# Patient Record
Sex: Female | Born: 1985 | Race: Black or African American | Hispanic: No | Marital: Single | Smoking: Former smoker
Health system: Southern US, Community
[De-identification: ages and names within clinical notes are randomized; demographics above are authoritative.]

## PROBLEM LIST (undated history)

## (undated) DIAGNOSIS — K219 Gastro-esophageal reflux disease without esophagitis: Secondary | ICD-10-CM

## (undated) DIAGNOSIS — J45909 Unspecified asthma, uncomplicated: Secondary | ICD-10-CM

## (undated) DIAGNOSIS — T7840XA Allergy, unspecified, initial encounter: Secondary | ICD-10-CM

## (undated) HISTORY — DX: Gastro-esophageal reflux disease without esophagitis: K21.9

## (undated) HISTORY — DX: Unspecified asthma, uncomplicated: J45.909

## (undated) HISTORY — DX: Allergy, unspecified, initial encounter: T78.40XA

---

## 2012-03-26 HISTORY — PX: SPINE SURGERY: SHX786

## 2015-09-30 DIAGNOSIS — F329 Major depressive disorder, single episode, unspecified: Secondary | ICD-10-CM | POA: Diagnosis not present

## 2015-09-30 DIAGNOSIS — Z13220 Encounter for screening for lipoid disorders: Secondary | ICD-10-CM | POA: Diagnosis not present

## 2015-09-30 DIAGNOSIS — Z791 Long term (current) use of non-steroidal anti-inflammatories (NSAID): Secondary | ICD-10-CM | POA: Diagnosis not present

## 2015-09-30 DIAGNOSIS — Z0000 Encounter for general adult medical examination without abnormal findings: Secondary | ICD-10-CM | POA: Diagnosis not present

## 2015-09-30 DIAGNOSIS — G894 Chronic pain syndrome: Secondary | ICD-10-CM | POA: Diagnosis not present

## 2015-10-09 ENCOUNTER — Encounter (HOSPITAL_COMMUNITY): Payer: Self-pay | Admitting: Emergency Medicine

## 2015-10-09 ENCOUNTER — Emergency Department (HOSPITAL_COMMUNITY)
Admission: EM | Admit: 2015-10-09 | Discharge: 2015-10-09 | Disposition: A | Discharge status: Home / Self Care | Payer: BLUE CROSS/BLUE SHIELD | Attending: Emergency Medicine | Admitting: Emergency Medicine

## 2015-10-09 ENCOUNTER — Emergency Department (HOSPITAL_COMMUNITY): Payer: BLUE CROSS/BLUE SHIELD

## 2015-10-09 DIAGNOSIS — M545 Low back pain, unspecified: Secondary | ICD-10-CM

## 2015-10-09 DIAGNOSIS — Z87891 Personal history of nicotine dependence: Secondary | ICD-10-CM | POA: Insufficient documentation

## 2015-10-09 LAB — POC URINE PREG, ED: Preg Test, Ur: NEGATIVE

## 2015-10-09 MED ORDER — PREDNISONE 20 MG PO TABS: 60.0000 mg | ORAL_TABLET | Freq: Every day | ORAL | Status: DC

## 2015-10-09 MED ORDER — PREDNISONE 20 MG PO TABS
60.0000 mg | ORAL_TABLET | Freq: Once | ORAL | Status: AC
  Administered 2015-10-09: 60 mg via ORAL
  Filled 2015-10-09: qty 3

## 2015-10-09 NOTE — Discharge Instructions (Signed)
Please read and follow all provided instructions.  Your diagnoses today include:  1. Bilateral low back pain without sciatica    Tests performed today include:  Vital signs - see below for your results today  Medications prescribed:   Take any prescribed medications only as directed.  Home care instructions:   Follow any educational materials contained in this packet  Please rest, use ice or heat on your back for the next several days  Do not lift, push, pull anything more than 10 pounds for the next week  Follow-up instructions: Please follow-up with your primary care provider in the next 1 week for further evaluation of your symptoms.   Return instructions:  SEEK IMMEDIATE MEDICAL ATTENTION IF YOU HAVE:  New numbness, tingling, weakness, or problem with the use of your arms or legs  Severe back pain not relieved with medications  Loss control of your bowels or bladder  Increasing pain in any areas of the body (such as chest or abdominal pain)  Shortness of breath, dizziness, or fainting.   Worsening nausea (feeling sick to your stomach), vomiting, fever, or sweats  Any other emergent concerns regarding your health   Additional Information:  Your vital signs today were: BP 129/84 mmHg   Pulse 65   Temp(Src) 98.2 F (36.8 C) (Oral)   Resp 18   SpO2 96%   LMP 10/08/2015 If your blood pressure (BP) was elevated above 135/85 this visit, please have this repeated by your doctor within one month. --------------

## 2015-10-09 NOTE — ED Provider Notes (Signed)
CSN: [id number]     Arrival date & time 10/09/15  1644 History   First MD Initiated Contact with Patient 10/09/15 1653     Chief Complaint  Patient presents with  . Back Pain   (Consider location/radiation/quality/duration/timing/severity/associated sxs/prior Treatment) HPI 30 y.o. female with a hx of Spinal Fusion from [facility] in 2014 in [place], presents to the Emergency Department today complaining of back pain x 2 weeks. Pt does not endorse any trauma or falls. Pt states that she used to go to PT therapy since her surgery, but has not seen one in sometime. Currently scheduling with PCP for PT follow up and Pain Cinic follow up. Has been seen by PCP on Monday and given Flexeril and Mobic. Pt thinks that back pain could be related to her lifting at her work at [employer] on top of not having her PT. Notes pain is 10/10 with turning. No pain at rest. No loss of bowel or bladder function. No saddle anesthesia. No fevers. No CP/SOB/ABD pain. No dysuria. No other symptoms noted.   No past medical history on file. No past surgical history on file. No family history on file. Social History  Substance Use Topics  . Smoking status: Not on file  . Smokeless tobacco: Not on file  . Alcohol Use: Not on file   OB History    No data available     Review of Systems  Constitutional: Negative for fever.  Gastrointestinal: Negative for nausea and vomiting.  Genitourinary: Negative for difficulty urinating.  Musculoskeletal: Positive for back pain.  Neurological: Negative for numbness.   Allergies  Review of patient's allergies indicates not on file.  Home Medications   Prior to Admission medications   Not on File   BP 129/84 mmHg  Pulse 65  Temp(Src) 98.2 F (36.8 C) (Oral)  Resp 18  SpO2 96%   Physical Exam  Constitutional: She is oriented to person, place, and time. She appears well-developed and well-nourished.  HENT:  Head: Normocephalic and atraumatic.  Eyes: EOM are normal. Pupils  are equal, round, and reactive to light.  Neck: Normal range of motion. Neck supple. No tracheal deviation present.  Cardiovascular: Normal rate, regular rhythm, normal heart sounds and intact distal pulses.   No murmur heard. Pulmonary/Chest: Effort normal and breath sounds normal. No respiratory distress. She has no wheezes. She has no rales. She exhibits no tenderness.  Abdominal: Soft.  Musculoskeletal: Normal range of motion.  Surgical Scar noted from Thoracic Spine to Lumbar spine. Non infectious. TTP along lumbar spine. Muscular tenderness bilateral low back. Pt able to ambulate without difficulty.   Neurological: She is alert and oriented to person, place, and time.  Skin: Skin is warm and dry.  Psychiatric: She has a normal mood and affect. Her behavior is normal. Thought content normal.  Nursing note and vitals reviewed.  ED Course  Procedures (including critical care time) Labs Review Labs Reviewed  POC URINE PREG, ED   Imaging Review Dg Thoracic Spine 2 View  10/09/2015  CLINICAL DATA:  Back pain for 2 weeks.  Prior surgery. EXAM: THORACIC SPINE 2 VIEWS ; LUMBAR SPINE - COMPLETE 4+ VIEW COMPARISON:  None. FINDINGS: The thoracolumbar vertebral body heights are maintained. The alignment is anatomic. There is no acute fracture static listhesis. There is posterior spinal fusion from T9 through L2 without hardware failure or complication. No aggressive osseous lesion. IMPRESSION: 1. No acute osseous injury of the lumbar spine. 2. No acute osseous injury of the thoracic  spine. 3. Posterior lumbar fusion from T9 through L2 without hardware failure or complication. Electronically Signed   By: Kathreen Devoid   On: 10/09/2015 18:49   Dg Lumbar Spine Complete  10/09/2015  CLINICAL DATA:  Back pain for 2 weeks.  Prior surgery. EXAM: THORACIC SPINE 2 VIEWS ; LUMBAR SPINE - COMPLETE 4+ VIEW COMPARISON:  None. FINDINGS: The thoracolumbar vertebral body heights are maintained. The alignment is  anatomic. There is no acute fracture static listhesis. There is posterior spinal fusion from T9 through L2 without hardware failure or complication. No aggressive osseous lesion. IMPRESSION: 1. No acute osseous injury of the lumbar spine. 2. No acute osseous injury of the thoracic spine. 3. Posterior lumbar fusion from T9 through L2 without hardware failure or complication. Electronically Signed   By: Kathreen Devoid   On: 10/09/2015 18:49   I have personally reviewed and evaluated these images and lab results as part of my medical decision-making.   EKG Interpretation None      MDM  I have reviewed and evaluated the relevant imaging studies.  I have reviewed the relevant previous healthcare records. I obtained HPI from historian.  ED Course:  Assessment: Patient is a 68 with a hx of F who presents to the ED with back pain. No neurological deficits appreciated. Patient is ambulatory. No warning symptoms of back pain including: fecal incontinence, urinary retention or overflow incontinence, night sweats, waking from sleep with back pain, unexplained fevers or weight loss, h/o cancer, IVDU, recent trauma. No concern for cauda equina, epidural abscess, or other serious cause of back pain. Imaging of Thoracic and Lumbar spine without acute abnormalities. Hardware in place. Conservative measures such as rest, ice/heat and pain medicine indicated with PCP follow-up if no improvement with conservative management. Given Rx Prednisone. At time of discharge, Patient is in no acute distress. Vital Signs are stable. Patient is able to ambulate. Patient able to tolerate PO.   Disposition/Plan:  DC Home Additional Verbal discharge instructions given and discussed with patient.  Pt Instructed to f/u with PCP in the next week for evaluation and treatment of symptoms. Return precautions given Pt acknowledges and agrees with plan  Supervising Physician Quintella Reichert, MD   Final diagnoses:  Bilateral low back  pain without sciatica     Shary Decamp, PA-C 10/09/15 1908  Quintella Reichert, MD 10/10/15 1452
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
## 2015-10-09 NOTE — ED Notes (Signed)
PA at bedside. Will complete triage when PA finished assessment.

## 2015-10-19 DIAGNOSIS — M545 Low back pain: Secondary | ICD-10-CM | POA: Diagnosis not present

## 2015-10-19 DIAGNOSIS — M546 Pain in thoracic spine: Secondary | ICD-10-CM | POA: Diagnosis not present

## 2015-10-19 DIAGNOSIS — F329 Major depressive disorder, single episode, unspecified: Secondary | ICD-10-CM | POA: Diagnosis not present

## 2015-10-19 DIAGNOSIS — G8929 Other chronic pain: Secondary | ICD-10-CM | POA: Diagnosis not present

## 2015-11-03 DIAGNOSIS — D473 Essential (hemorrhagic) thrombocythemia: Secondary | ICD-10-CM | POA: Diagnosis not present

## 2015-11-03 DIAGNOSIS — E875 Hyperkalemia: Secondary | ICD-10-CM | POA: Diagnosis not present

## 2015-11-03 DIAGNOSIS — R011 Cardiac murmur, unspecified: Secondary | ICD-10-CM | POA: Diagnosis not present

## 2015-11-03 DIAGNOSIS — G894 Chronic pain syndrome: Secondary | ICD-10-CM | POA: Diagnosis not present

## 2015-11-03 LAB — LIPID PANEL
CHOLESTEROL: 168 (ref 0–200)
HDL: 63 (ref 35–70)
LDL Cholesterol: 90
Triglycerides: 76 (ref 40–160)

## 2015-11-03 LAB — TSH: TSH: 0.82 (ref 0.41–5.90)

## 2015-11-03 LAB — BASIC METABOLIC PANEL
BUN: 16 (ref 4–21)
CREATININE: 0.7 (ref 0.5–1.1)
GLUCOSE: 96
POTASSIUM: 4.6 (ref 3.4–5.3)
Sodium: 138 (ref 137–147)

## 2015-11-03 LAB — CBC AND DIFFERENTIAL
HCT: 37 (ref 36–46)
Hemoglobin: 12 (ref 12.0–16.0)
Platelets: 388 (ref 150–399)
WBC: 9

## 2015-12-06 ENCOUNTER — Ambulatory Visit: Payer: BLUE CROSS/BLUE SHIELD | Admitting: Nurse Practitioner

## 2015-12-15 DIAGNOSIS — M549 Dorsalgia, unspecified: Secondary | ICD-10-CM | POA: Diagnosis not present

## 2015-12-19 DIAGNOSIS — G8929 Other chronic pain: Secondary | ICD-10-CM | POA: Diagnosis not present

## 2015-12-19 DIAGNOSIS — G894 Chronic pain syndrome: Secondary | ICD-10-CM | POA: Diagnosis not present

## 2015-12-19 DIAGNOSIS — M545 Low back pain: Secondary | ICD-10-CM | POA: Diagnosis not present

## 2015-12-19 DIAGNOSIS — M546 Pain in thoracic spine: Secondary | ICD-10-CM | POA: Diagnosis not present

## 2015-12-28 ENCOUNTER — Ambulatory Visit: Payer: BLUE CROSS/BLUE SHIELD | Admitting: Nurse Practitioner

## 2016-03-30 DIAGNOSIS — G894 Chronic pain syndrome: Secondary | ICD-10-CM | POA: Diagnosis not present

## 2016-08-11 ENCOUNTER — Encounter (HOSPITAL_COMMUNITY): Payer: Self-pay | Admitting: Nurse Practitioner

## 2016-08-11 ENCOUNTER — Emergency Department (HOSPITAL_COMMUNITY)
Admission: EM | Admit: 2016-08-11 | Discharge: 2016-08-11 | Disposition: A | Discharge status: Home / Self Care | Payer: Medicare Other | Attending: Emergency Medicine | Admitting: Emergency Medicine

## 2016-08-11 DIAGNOSIS — M546 Pain in thoracic spine: Secondary | ICD-10-CM | POA: Insufficient documentation

## 2016-08-11 DIAGNOSIS — Z79899 Other long term (current) drug therapy: Secondary | ICD-10-CM | POA: Diagnosis not present

## 2016-08-11 DIAGNOSIS — Z87891 Personal history of nicotine dependence: Secondary | ICD-10-CM | POA: Diagnosis not present

## 2016-08-11 DIAGNOSIS — G8929 Other chronic pain: Secondary | ICD-10-CM | POA: Insufficient documentation

## 2016-08-11 MED ORDER — HYDROCODONE-ACETAMINOPHEN 5-325 MG PO TABS
1.0000 | ORAL_TABLET | Freq: Once | ORAL | Status: AC
  Administered 2016-08-11: 1 via ORAL
  Filled 2016-08-11: qty 1

## 2016-08-11 MED ORDER — SODIUM CHLORIDE 0.9 % IV BOLUS (SEPSIS): 1000.0000 mL | Freq: Once | INTRAVENOUS | Status: DC

## 2016-08-11 NOTE — Discharge Instructions (Signed)
Please read attached information. If you experience any new or worsening signs or symptoms please return to the emergency room for evaluation. Please follow-up with your primary care provider or specialist as discussed.  °
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
## 2016-08-11 NOTE — ED Triage Notes (Signed)
Pt c/o severe back pain. States she has hardware in her spine and was in the care of a neurologist in [place].

## 2016-09-11 ENCOUNTER — Encounter: Payer: Self-pay | Admitting: Family Medicine

## 2016-09-11 ENCOUNTER — Ambulatory Visit (INDEPENDENT_AMBULATORY_CARE_PROVIDER_SITE_OTHER): Payer: Medicare Other | Admitting: Family Medicine

## 2016-09-11 VITALS — BP 126/80 | HR 73 | Resp 12 | Ht 60.0 in | Wt 163.0 lb

## 2016-09-11 DIAGNOSIS — G8929 Other chronic pain: Secondary | ICD-10-CM | POA: Diagnosis not present

## 2016-09-11 DIAGNOSIS — G894 Chronic pain syndrome: Secondary | ICD-10-CM | POA: Diagnosis not present

## 2016-09-11 DIAGNOSIS — M549 Dorsalgia, unspecified: Secondary | ICD-10-CM

## 2016-09-11 DIAGNOSIS — R202 Paresthesia of skin: Secondary | ICD-10-CM | POA: Insufficient documentation

## 2016-09-11 DIAGNOSIS — R200 Anesthesia of skin: Secondary | ICD-10-CM | POA: Insufficient documentation

## 2016-09-11 DIAGNOSIS — M5410 Radiculopathy, site unspecified: Secondary | ICD-10-CM | POA: Insufficient documentation

## 2016-09-11 DIAGNOSIS — R42 Dizziness and giddiness: Secondary | ICD-10-CM | POA: Diagnosis not present

## 2016-09-11 DIAGNOSIS — Z6831 Body mass index (BMI) 31.0-31.9, adult: Secondary | ICD-10-CM

## 2016-09-11 DIAGNOSIS — E669 Obesity, unspecified: Secondary | ICD-10-CM

## 2016-09-11 MED ORDER — DULOXETINE HCL 30 MG PO CPEP: 30.0000 mg | ORAL_CAPSULE | Freq: Every day | ORAL | 1 refills | Status: DC

## 2016-09-11 MED ORDER — MELOXICAM 15 MG PO TABS: 15.0000 mg | ORAL_TABLET | Freq: Every day | ORAL | 1 refills | Status: DC

## 2016-09-11 MED ORDER — GABAPENTIN 600 MG PO TABS: 600.0000 mg | ORAL_TABLET | Freq: Two times a day (BID) | ORAL | 1 refills | Status: DC
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
## 2016-09-11 NOTE — Patient Instructions (Addendum)
A few things to remember from today's visit:   Chronic pain disorder - Plan: Ambulatory referral to Pain Clinic  Radicular pain of both lower extremities - Plan: Ambulatory referral to Pain Clinic, gabapentin (NEURONTIN) 600 MG tablet, DULoxetine (CYMBALTA) 30 MG capsule  Chronic bilateral back pain, unspecified back location - Plan: Ambulatory referral to Pain Clinic, meloxicam (MOBIC) 15 MG tablet  Class 1 obesity without serious comorbidity with body mass index (BMI) of 31.0 to 31.9 in adult, unspecified obesity type  Lightheadedness   Please be sure medication list is accurate. If a new problem present, please set up appointment sooner than planned today.

## 2016-09-15 ENCOUNTER — Encounter: Payer: Self-pay | Admitting: Family Medicine

## 2016-09-21 ENCOUNTER — Encounter: Payer: Self-pay | Admitting: Family Medicine

## 2016-09-25 ENCOUNTER — Telehealth: Payer: Self-pay | Admitting: Family Medicine

## 2016-09-25 NOTE — Telephone Encounter (Signed)
° ° °  Pt ask if you Summer Frey would give her a call. She is requesting some type of letter about her LOA    [phone]

## 2016-09-27 NOTE — Telephone Encounter (Signed)
Left voicemail for patient to call the office back.   

## 2016-10-01 NOTE — Telephone Encounter (Signed)
Left voicemail for patient to call the office back & to leave the details of what she needs in the letter when she calls back.

## 2016-10-05 NOTE — Telephone Encounter (Signed)
Have been unsuccessful in reaching patient, will mail letter to patient asking to return the call or we can discuss this during her office visit on 7/31.
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
## 2016-10-23 ENCOUNTER — Encounter: Payer: Self-pay | Admitting: Family Medicine

## 2016-10-23 ENCOUNTER — Ambulatory Visit (INDEPENDENT_AMBULATORY_CARE_PROVIDER_SITE_OTHER): Payer: Medicare Other | Admitting: Family Medicine

## 2016-10-23 VITALS — BP 110/70 | HR 66 | Resp 12 | Ht 60.0 in | Wt 163.0 lb

## 2016-10-23 DIAGNOSIS — G8929 Other chronic pain: Secondary | ICD-10-CM

## 2016-10-23 DIAGNOSIS — M5410 Radiculopathy, site unspecified: Secondary | ICD-10-CM | POA: Diagnosis not present

## 2016-10-23 DIAGNOSIS — M549 Dorsalgia, unspecified: Secondary | ICD-10-CM

## 2016-10-23 MED ORDER — MELOXICAM 15 MG PO TABS: 15.0000 mg | ORAL_TABLET | Freq: Every day | ORAL | 1 refills | Status: DC

## 2016-10-23 MED ORDER — DULOXETINE HCL 30 MG PO CPEP: 30.0000 mg | ORAL_CAPSULE | Freq: Every day | ORAL | 1 refills | Status: DC

## 2016-10-23 NOTE — Patient Instructions (Addendum)
A few things to remember from today's visit:   Chronic bilateral back pain, unspecified back location - Plan: meloxicam (MOBIC) 15 MG tablet  Radicular pain of both lower extremities - Plan: DULoxetine (CYMBALTA) 30 MG capsule  Pending appt with pain clinic.  Form will be filled out an faxed.  Please find about about PT/pool exercises and let me know if your insurance will cover for any of those.    Please be sure medication list is accurate. If a new problem present, please set up appointment sooner than planned today.

## 2016-11-09 ENCOUNTER — Telehealth: Payer: Self-pay | Admitting: Family Medicine

## 2016-11-09 NOTE — Telephone Encounter (Signed)
The form is filled out. I was waiting for ortho evaluation for most detail information about restrictions they did recommend after evaluation. Form can be faxed as it is now.  Thanks, BJ

## 2016-11-09 NOTE — Telephone Encounter (Signed)
Pt states her FMLA paperwork has not been received by her employer and she wants to know what's up.

## 2016-11-12 NOTE — Telephone Encounter (Signed)
The forms have been faxed.  Thanks!

## 2016-11-13 ENCOUNTER — Encounter: Payer: Self-pay | Admitting: Physical Medicine & Rehabilitation

## 2016-11-27 ENCOUNTER — Encounter: Payer: Self-pay | Admitting: Family Medicine

## 2016-11-30 ENCOUNTER — Telehealth: Payer: Self-pay

## 2016-11-30 NOTE — Telephone Encounter (Signed)
Summer Frey is calling to inquire about forms they have faxed over twice and not received back. Faxed on 8/28 and 9/4.

## 2016-11-30 NOTE — Telephone Encounter (Signed)
Forms were faxed back today.

## 2016-12-03 ENCOUNTER — Telehealth: Payer: Self-pay | Admitting: Family Medicine
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
## 2016-12-04 NOTE — Telephone Encounter (Signed)
Pt calling to follow up on if we received paperwork.
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
## 2016-12-05 NOTE — Telephone Encounter (Signed)
We have the paperwork, it is on MD's desk to be filled out.

## 2016-12-07 ENCOUNTER — Ambulatory Visit: Payer: Medicare Other | Admitting: Physical Medicine & Rehabilitation

## 2016-12-10 ENCOUNTER — Telehealth: Payer: Self-pay | Admitting: Family Medicine

## 2016-12-10 NOTE — Telephone Encounter (Signed)
Patient calling in regards to [facility] forms sent from [place].  Patient would like a call back regarding the status of this process.   The companies contact number is: [phone]

## 2016-12-10 NOTE — Telephone Encounter (Addendum)
I left a voicemail for patient. These papers were faxed on 9/14 with a confirmation received. I will re-fax them again. I let patient know in my voicemail they have been faxed over & a confirmation has been received already, but that I would re-fax them again. I also called and spoke with Bebe Liter, they have received the paper work that is needed and they will let her know if anything else is needed. Papers will not be re-faxed as they have received them twice.

## 2016-12-17 ENCOUNTER — Ambulatory Visit: Payer: Medicare Other | Admitting: Physical Medicine & Rehabilitation

## 2016-12-18 ENCOUNTER — Ambulatory Visit (HOSPITAL_BASED_OUTPATIENT_CLINIC_OR_DEPARTMENT_OTHER): Payer: Medicare Other | Admitting: Physical Medicine & Rehabilitation

## 2016-12-18 ENCOUNTER — Encounter: Payer: Medicare Other | Attending: Physical Medicine & Rehabilitation

## 2016-12-18 ENCOUNTER — Encounter: Payer: Self-pay | Admitting: Physical Medicine & Rehabilitation

## 2016-12-18 VITALS — BP 105/74 | HR 63

## 2016-12-18 DIAGNOSIS — M961 Postlaminectomy syndrome, not elsewhere classified: Secondary | ICD-10-CM | POA: Diagnosis not present

## 2016-12-18 DIAGNOSIS — M5410 Radiculopathy, site unspecified: Secondary | ICD-10-CM | POA: Diagnosis not present

## 2016-12-18 DIAGNOSIS — Z87891 Personal history of nicotine dependence: Secondary | ICD-10-CM | POA: Insufficient documentation

## 2016-12-18 DIAGNOSIS — R202 Paresthesia of skin: Secondary | ICD-10-CM | POA: Diagnosis not present

## 2016-12-18 DIAGNOSIS — F4320 Adjustment disorder, unspecified: Secondary | ICD-10-CM

## 2016-12-18 DIAGNOSIS — M546 Pain in thoracic spine: Secondary | ICD-10-CM | POA: Diagnosis not present

## 2016-12-18 DIAGNOSIS — K219 Gastro-esophageal reflux disease without esophagitis: Secondary | ICD-10-CM | POA: Diagnosis not present

## 2016-12-18 DIAGNOSIS — M545 Low back pain: Secondary | ICD-10-CM | POA: Diagnosis not present

## 2016-12-18 MED ORDER — GABAPENTIN 800 MG PO TABS: 800.0000 mg | ORAL_TABLET | Freq: Two times a day (BID) | ORAL | 1 refills | Status: DC

## 2016-12-18 NOTE — Patient Instructions (Signed)
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
## 2017-01-29 ENCOUNTER — Ambulatory Visit: Payer: Medicare Other | Admitting: Physical Medicine & Rehabilitation

## 2017-01-29 ENCOUNTER — Encounter: Payer: Medicare Other | Attending: Physical Medicine & Rehabilitation

## 2017-01-29 DIAGNOSIS — M5410 Radiculopathy, site unspecified: Secondary | ICD-10-CM | POA: Insufficient documentation

## 2017-01-29 DIAGNOSIS — R202 Paresthesia of skin: Secondary | ICD-10-CM | POA: Insufficient documentation

## 2017-01-29 DIAGNOSIS — M961 Postlaminectomy syndrome, not elsewhere classified: Secondary | ICD-10-CM | POA: Insufficient documentation

## 2017-01-29 DIAGNOSIS — Z87891 Personal history of nicotine dependence: Secondary | ICD-10-CM | POA: Insufficient documentation

## 2017-01-29 DIAGNOSIS — K219 Gastro-esophageal reflux disease without esophagitis: Secondary | ICD-10-CM | POA: Insufficient documentation

## 2017-01-29 DIAGNOSIS — M545 Low back pain: Secondary | ICD-10-CM | POA: Insufficient documentation

## 2017-01-29 DIAGNOSIS — F4320 Adjustment disorder, unspecified: Secondary | ICD-10-CM | POA: Insufficient documentation

## 2017-01-29 DIAGNOSIS — M546 Pain in thoracic spine: Secondary | ICD-10-CM | POA: Insufficient documentation

## 2017-01-30 ENCOUNTER — Encounter: Payer: Medicare Other | Admitting: Family Medicine

## 2017-02-28 ENCOUNTER — Encounter: Payer: Medicare Other | Attending: Physical Medicine & Rehabilitation | Admitting: Psychology

## 2017-02-28 DIAGNOSIS — M5410 Radiculopathy, site unspecified: Secondary | ICD-10-CM | POA: Insufficient documentation

## 2017-02-28 DIAGNOSIS — M546 Pain in thoracic spine: Secondary | ICD-10-CM | POA: Insufficient documentation

## 2017-02-28 DIAGNOSIS — Z87891 Personal history of nicotine dependence: Secondary | ICD-10-CM | POA: Insufficient documentation

## 2017-02-28 DIAGNOSIS — F4320 Adjustment disorder, unspecified: Secondary | ICD-10-CM | POA: Insufficient documentation

## 2017-02-28 DIAGNOSIS — R202 Paresthesia of skin: Secondary | ICD-10-CM | POA: Insufficient documentation

## 2017-02-28 DIAGNOSIS — K219 Gastro-esophageal reflux disease without esophagitis: Secondary | ICD-10-CM | POA: Insufficient documentation

## 2017-02-28 DIAGNOSIS — M545 Low back pain: Secondary | ICD-10-CM | POA: Insufficient documentation

## 2017-02-28 DIAGNOSIS — M961 Postlaminectomy syndrome, not elsewhere classified: Secondary | ICD-10-CM | POA: Insufficient documentation

## 2017-03-12 ENCOUNTER — Ambulatory Visit (INDEPENDENT_AMBULATORY_CARE_PROVIDER_SITE_OTHER): Payer: Medicare Other | Admitting: Family Medicine

## 2017-03-12 ENCOUNTER — Encounter: Payer: Self-pay | Admitting: Family Medicine

## 2017-03-12 ENCOUNTER — Encounter: Payer: Self-pay | Admitting: *Deleted

## 2017-03-12 VITALS — BP 110/66 | HR 85 | Temp 98.5°F | Ht 60.0 in | Wt 155.0 lb

## 2017-03-12 DIAGNOSIS — B354 Tinea corporis: Secondary | ICD-10-CM | POA: Diagnosis not present

## 2017-03-12 DIAGNOSIS — E669 Obesity, unspecified: Secondary | ICD-10-CM

## 2017-03-12 DIAGNOSIS — G8929 Other chronic pain: Secondary | ICD-10-CM | POA: Diagnosis not present

## 2017-03-12 DIAGNOSIS — M549 Dorsalgia, unspecified: Secondary | ICD-10-CM

## 2017-03-12 DIAGNOSIS — M5410 Radiculopathy, site unspecified: Secondary | ICD-10-CM | POA: Diagnosis not present

## 2017-03-12 DIAGNOSIS — F321 Major depressive disorder, single episode, moderate: Secondary | ICD-10-CM | POA: Diagnosis not present

## 2017-03-12 MED ORDER — BUPROPION HCL ER (SR) 150 MG PO TB12: 150.0000 mg | ORAL_TABLET | Freq: Two times a day (BID) | ORAL | 1 refills | Status: AC
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
## 2017-03-12 NOTE — Patient Instructions (Addendum)
A few things to remember from today's visit:   Class 1 obesity with serious comorbidity and body mass index (BMI) of 31.0 to 31.9 in adult, unspecified obesity type - Plan: buPROPion (WELLBUTRIN SR) 150 MG 12 hr tablet  Depression, major, single episode, moderate (HCC) - Plan: buPROPion (WELLBUTRIN SR) 150 MG 12 hr tablet  Radicular pain of both lower extremities  Wellbutrin once daily for a week then 2 times daily.   Please be sure medication list is accurate. If a new problem present, please set up appointment sooner than planned today.

## 2017-03-14 ENCOUNTER — Telehealth: Payer: Self-pay | Admitting: Family Medicine

## 2017-03-14 NOTE — Telephone Encounter (Signed)
Copied from [place] [id number]. Topic: General - Other >> Mar 14, 2017 11:07 AM Yvette Rack wrote: Reason for CRM: Timmothy Sours from [facility] [phone] calling for pt medical records they sent a fax on 03-11-17

## 2017-03-15 NOTE — Telephone Encounter (Signed)
Pt calling about these records today and needing them sent asap.

## 2017-04-01 ENCOUNTER — Ambulatory Visit: Payer: Medicare Other | Admitting: Physical Medicine & Rehabilitation

## 2017-04-01 ENCOUNTER — Encounter: Payer: Medicare Other | Attending: Physical Medicine & Rehabilitation

## 2017-04-01 DIAGNOSIS — Z87891 Personal history of nicotine dependence: Secondary | ICD-10-CM | POA: Insufficient documentation

## 2017-04-01 DIAGNOSIS — M545 Low back pain: Secondary | ICD-10-CM | POA: Insufficient documentation

## 2017-04-01 DIAGNOSIS — M961 Postlaminectomy syndrome, not elsewhere classified: Secondary | ICD-10-CM | POA: Insufficient documentation

## 2017-04-01 DIAGNOSIS — R202 Paresthesia of skin: Secondary | ICD-10-CM | POA: Insufficient documentation

## 2017-04-01 DIAGNOSIS — K219 Gastro-esophageal reflux disease without esophagitis: Secondary | ICD-10-CM | POA: Insufficient documentation

## 2017-04-01 DIAGNOSIS — M5410 Radiculopathy, site unspecified: Secondary | ICD-10-CM | POA: Insufficient documentation

## 2017-04-01 DIAGNOSIS — M546 Pain in thoracic spine: Secondary | ICD-10-CM | POA: Insufficient documentation

## 2017-04-01 DIAGNOSIS — F4320 Adjustment disorder, unspecified: Secondary | ICD-10-CM | POA: Insufficient documentation

## 2017-04-09 ENCOUNTER — Encounter: Payer: Self-pay | Admitting: *Deleted

## 2017-04-09 ENCOUNTER — Encounter: Payer: Self-pay | Admitting: Family Medicine

## 2017-04-09 ENCOUNTER — Ambulatory Visit (INDEPENDENT_AMBULATORY_CARE_PROVIDER_SITE_OTHER): Payer: Medicare Other | Admitting: Family Medicine

## 2017-04-09 VITALS — BP 112/60 | HR 74 | Temp 98.2°F | Resp 12 | Ht 60.0 in | Wt 155.2 lb

## 2017-04-09 DIAGNOSIS — F321 Major depressive disorder, single episode, moderate: Secondary | ICD-10-CM

## 2017-04-09 DIAGNOSIS — M549 Dorsalgia, unspecified: Secondary | ICD-10-CM

## 2017-04-09 DIAGNOSIS — M5410 Radiculopathy, site unspecified: Secondary | ICD-10-CM | POA: Diagnosis not present

## 2017-04-09 DIAGNOSIS — G8929 Other chronic pain: Secondary | ICD-10-CM | POA: Diagnosis not present

## 2017-04-09 NOTE — Progress Notes (Signed)
ACUTE VISIT   HPI:  Chief Complaint  Patient presents with  . Release back to work    Need note stating she can return to work without any lifting restrictions, but wants to keep coming in for follow-up on issue    [name] is a 32 y.o. female, who is here today requesting another letter, so she can return to work full-time. Last visit, 03/12/2017, she requested a letter with a few restrictions so she can apply to a new position at [employer]. According to patient, she applied to another position that was available but she needs another letter with no restrictions. 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 History of chronic pain. She last follow with [employer] on 12/18/2016 she she is not sure about next follow-up appointment. Missed PT appointment and has not received information about psychology evaluation.   Lateral feet burning sensation, constant left lower extremity burning pain. Constant thoracic and lower back pain, aching and tightening sensation, 7/10.  Last office visit Wellbutrin was recommended to help with depressed mood and weight loss. She did not fill prescription for Wellbutrin due to cost but planning on filling prescription once she is able to pay for it. She denies suicidal thoughts.   Review of Systems  Constitutional: Negative for activity change, appetite change, fatigue and fever.  HENT: Negative for mouth sores, nosebleeds and trouble swallowing.   Eyes: Negative for redness and visual disturbance.  Respiratory: Negative for shortness of breath and wheezing.   Cardiovascular: Negative for chest pain, palpitations and leg swelling.  Gastrointestinal: Negative for abdominal pain, nausea and vomiting.       Negative for changes in bowel habits.  Genitourinary: Negative for decreased urine volume and hematuria.  Musculoskeletal: Positive for  back pain and myalgias.  Neurological: Negative for syncope, weakness and headaches.  Psychiatric/Behavioral: Negative for confusion and suicidal ideas. The patient is nervous/anxious.       Current Outpatient Medications on File Prior to Visit  Medication Sig Dispense Refill  . buPROPion (WELLBUTRIN SR) 150 MG 12 hr tablet Take 1 tablet (150 mg total) by mouth 2 (two) times daily. 60 tablet 1   No current facility-administered medications on file prior to visit.      Past Medical History:  Diagnosis Date  . Allergy   . Asthma   . GERD (gastroesophageal reflux disease)    No Known Allergies  Social History   Socioeconomic History  . Marital status: Single    Spouse name: None  . Number of children: None  . Years of education: None  . Highest education level: None  Social Needs  . Financial resource strain: None  . Food insecurity - worry: None  . Food insecurity - inability: None  . Transportation needs - medical: None  . Transportation needs - non-medical: None  Occupational History  . None  Tobacco Use  . Smoking status: Former Research scientist (life sciences)  . Smokeless tobacco: Never Used  Substance and Sexual Activity  . Alcohol use: Yes    Comment: occasional  . Drug use: No  . Sexual activity: None  Other Topics Concern  . None  Social History Narrative  . None    Vitals:   04/09/17 1415  BP: 112/60  Pulse: 74  Resp: 12  Temp: 98.2 F (36.8 C)  SpO2: 98%   Body mass index is 30.Herreid  kg/m.      Physical Exam  Nursing note and vitals reviewed. Constitutional: She is oriented to person, place, and time. She appears well-developed. No distress.  HENT:  Head: Normocephalic and atraumatic.  Mouth/Throat: Oropharynx is clear and moist and mucous membranes are normal.  Eyes: Conjunctivae are normal. Pupils are equal, round, and reactive to light.  Cardiovascular: Normal rate and regular rhythm.  No murmur heard. Pulses:      Dorsalis pedis pulses are 2+ on the  right side, and 2+ on the left side.  Respiratory: Effort normal and breath sounds normal. No respiratory distress.  GI: Soft. She exhibits no mass. There is no hepatomegaly. There is no tenderness.  Musculoskeletal: She exhibits no edema.       Thoracic back: She exhibits tenderness. She exhibits no bony tenderness.       Lumbar back: She exhibits tenderness. She exhibits no bony tenderness.  Tenderness upon palpation of paraspinal thoracic and upper lumbar muscles.   Lymphadenopathy:    She has no cervical adenopathy.  Neurological: She is alert and oriented to person, place, and time. She has normal strength. Coordination and gait normal.  Skin: Skin is warm. No erythema.  Psychiatric: She has a normal mood and affect.  Well groomed, good eye contact.    ASSESSMENT AND PLAN:   [employer] was seen today for release back to work.  Diagnoses and all orders for this visit:  Chronic bilateral back pain, unspecified back location  Related for employer given today with no restrictions, she would like to go back on 05/01/2017. Strongly recommend arrange an appointment with [employer].  Radicular pain of both lower extremities  Stable overall. Continue following with [employer]  Depression, major, single episode, moderate ([place])  Stable overall. We discussed some side effects of Wellbutrin, which she is planning on start taking when she is able to fill the prescription.    Return in about 3 months (around 07/08/2017).    [employer] Frey G. Martinique, MD  [facility]. [place] office.

## 2017-04-09 NOTE — Patient Instructions (Addendum)
A few things to remember from today's visit:   Chronic bilateral back pain, unspecified back location  Radicular pain of both lower extremities  Please be sure to schedule appointment for follow-up with DrMarland Kitchen Letta Pate. Reschedule PT. Letter provided today. If FMLA is needed I would lead to pain management.  Fill prescription for Wellbutrin.  Please be sure medication list is accurate. If a new problem present, please set up appointment sooner than planned today.

## 2017-05-03 ENCOUNTER — Telehealth: Payer: Self-pay | Admitting: Family Medicine

## 2017-05-03 NOTE — Telephone Encounter (Signed)
Copied from [place] [id number]. Topic: General - Other >> Apr 26, 2017 10:38 AM Bea Graff, NT wrote: Reason for CRM: Ren from [facility] calling to check status on a disability form questionnaire they sent over on 04/17/17. CB#: [phone] Ref#: [id number]  >> May 03, 2017 11:41 AM Boyd Kerbs wrote: Form on restrictions on returning to work.  This is form that was faxed 2/1 for 2nd time.  Needing this filled out and faxed to # [phone]  HHI#[id number] needs to be on cover sheet. refaxing today.

## 2017-05-08 NOTE — Telephone Encounter (Signed)
Tried calling number several times, unable to reach a live person to ask about paperwork.

## 2017-05-14 NOTE — Telephone Encounter (Signed)
Spoke with [employer], informed him that soon as I get the paperwork, I would give it to Dr. Martinique for completion. [employer] stated that he sent it about 10 minutes ago, went up front and looked for paperwork myself, still don't have it. [employer] is aware that paperwork will be faxed back once it is completed.

## 2017-05-14 NOTE — Telephone Encounter (Signed)
[employer] calling to inquire about disability forms that they never received. Advised them that [place]  called multiple times.  Rep will refax now and would like a call again to discuss. CB#: [phone] Ref#: [id number]

## 2017-05-16 NOTE — Telephone Encounter (Signed)
Spoke with Myriam Jacobson he verified that form was completed.

## 2017-07-05 NOTE — Telephone Encounter (Signed)
This encounter was created in error - please disregard.

## 2017-07-10 ENCOUNTER — Telehealth: Payer: Self-pay | Admitting: *Deleted

## 2017-07-10 NOTE — Telephone Encounter (Signed)
Copied from [place]. Topic: Quick Communication - See Telephone Encounter >> Jul 10, 2017 12:14 PM Hewitt Shorts wrote: CRM for notification. See Telephone encounter for: 07/10/17.pt called stating that the original call dropped but she was trying to get lisa Dr. Martinique assistant to call her back regarding some forms that she states is personal and would rather not go into detail about it   Best number [phone]

## 2017-07-10 NOTE — Telephone Encounter (Signed)
Spoke with patient and she informed me that some disability paperwork should be coming for [place] to sign due to her inability to work because of her back. Patient stated that she just wanted to give [place] a heads up.

## 2017-07-18 NOTE — Telephone Encounter (Signed)
ROI processed 03/11/17

## 2017-09-05 ENCOUNTER — Encounter

## 2018-02-06 NOTE — Telephone Encounter (Signed)
Pt requesting call from [place] regarding her disability and the forms. I don't see anything under media in April and advised pt that she was due for f/u visit 06/2017. Pt has now moved to [facility]. Please call [phone].

## 2018-02-07 NOTE — Telephone Encounter (Signed)
Spoke with patient and she stated that she still hasn't found a PCP in [place], she has been debating on whether or not to stay there or come back here. Patient also stated that she has not gotten around to sending the forms like she stated she would back in April. Patient stated that she would call me in a week to let me know when she send the forms. No further assistance needed at this time.

## 2018-03-13 ENCOUNTER — Telehealth: Payer: Self-pay

## 2018-03-13 NOTE — Telephone Encounter (Signed)
Copied from [place] #[id number]. Topic: General - Other >> Mar 13, 2018  3:17 PM Carolyn Stare wrote:  Pt call to ask if Dr Martinique received [place] paperwork for her

## 2018-03-14 NOTE — Telephone Encounter (Signed)
Pt will have [employer] refax FMLA form

## 2018-03-17 NOTE — Telephone Encounter (Signed)
As of 03/17/18, have not seen form, still waiting on form.

## 2018-03-27 NOTE — Telephone Encounter (Signed)
Left detailed message informing patient that office still hasn't received the forms.

## 2018-04-01 NOTE — Telephone Encounter (Signed)
Spoke with patient and informed her that office still hadn't received forms. Patient stated that she would call [employer] and have them fax forms again.

## 2018-04-09 ENCOUNTER — Encounter: Payer: Self-pay | Admitting: *Deleted

## 2018-04-09 NOTE — Telephone Encounter (Signed)
Patient informed that office hasn't received paperwork.

## 2018-04-16 NOTE — Telephone Encounter (Signed)
Still waiting on forms to be faxed from [facility]. Received my chart message from patient stating that forms will be faxed soon.

## 2018-05-07 NOTE — Telephone Encounter (Signed)
Patient stated that she will contact me when forms are faxed from [facility]. Nothing further needed at this time.

## 2018-05-19 ENCOUNTER — Telehealth: Payer: Self-pay | Admitting: *Deleted

## 2018-05-19 NOTE — Telephone Encounter (Signed)
Spoke with patient and she wanted to know if PCP could provide a letter for her stating that she can only work part-time hours. Patient hasn't been able to work full-time for some months now due to medical condition. [employer] will fax FMLA paperwork.  Copied from [place] [id number]. Topic: General - Other >> May 15, 2018  2:47 PM Yvette Rack wrote: Reason for CRM: pt calling to speak with Lenore Manner  she state sits about the forms that need to be faxed that's all she would say

## 2018-05-21 NOTE — Telephone Encounter (Signed)
She is supposed to be following with Dr. Delynn Flavin.   She needs to arrange follow-up appointment, so an evaluation can be performed to justify request.  Thanks, BJ

## 2018-05-23 ENCOUNTER — Encounter: Payer: Self-pay | Admitting: *Deleted

## 2018-05-23 NOTE — Telephone Encounter (Signed)
Patient informed. 

## 2018-07-12 IMAGING — CR DG LUMBAR SPINE COMPLETE 4+V
5 series · 5 of 5 positions shown · non-contrast
Comparison: None.

CLINICAL DATA: Back pain for 2 weeks.  Prior surgery.

EXAM:
THORACIC SPINE 2 VIEWS ; LUMBAR SPINE - COMPLETE 4+ VIEW

[t lumbar spine ap]
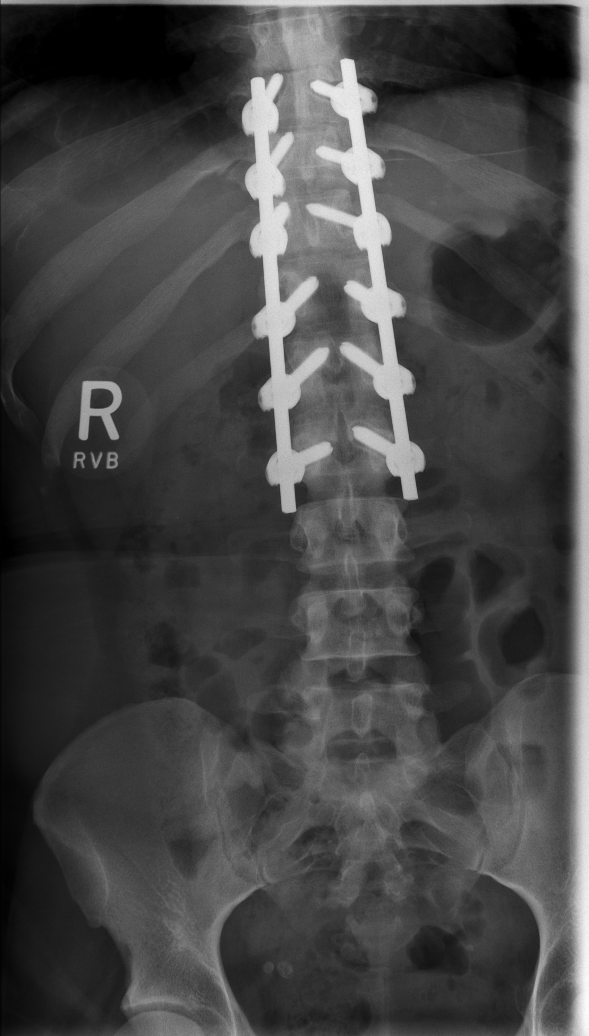

[t lumbar spine obl (1 of 2)]
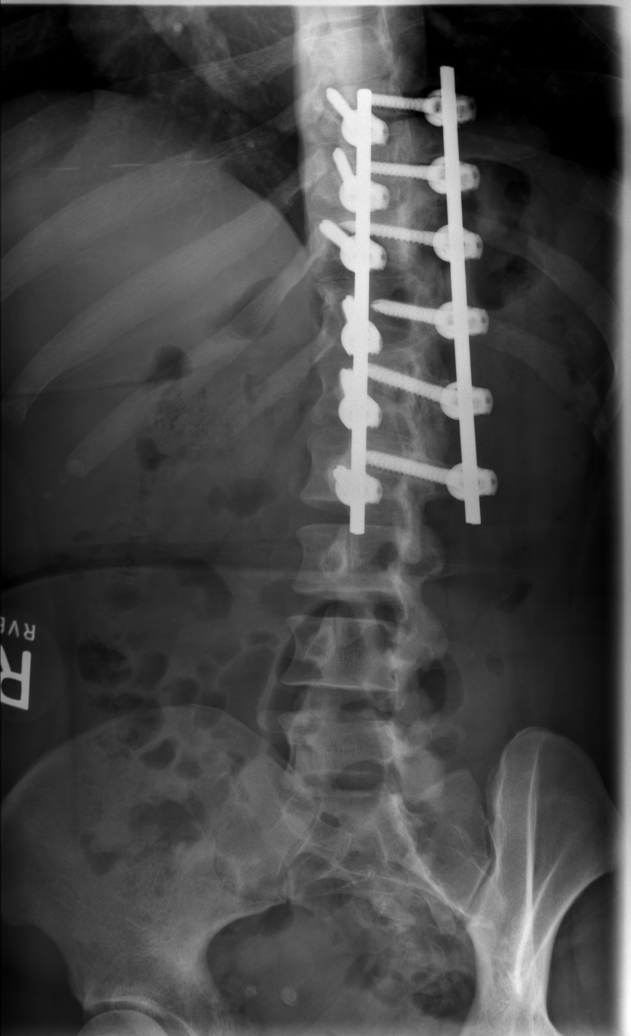

[t lumbar spine obl (2 of 2)]
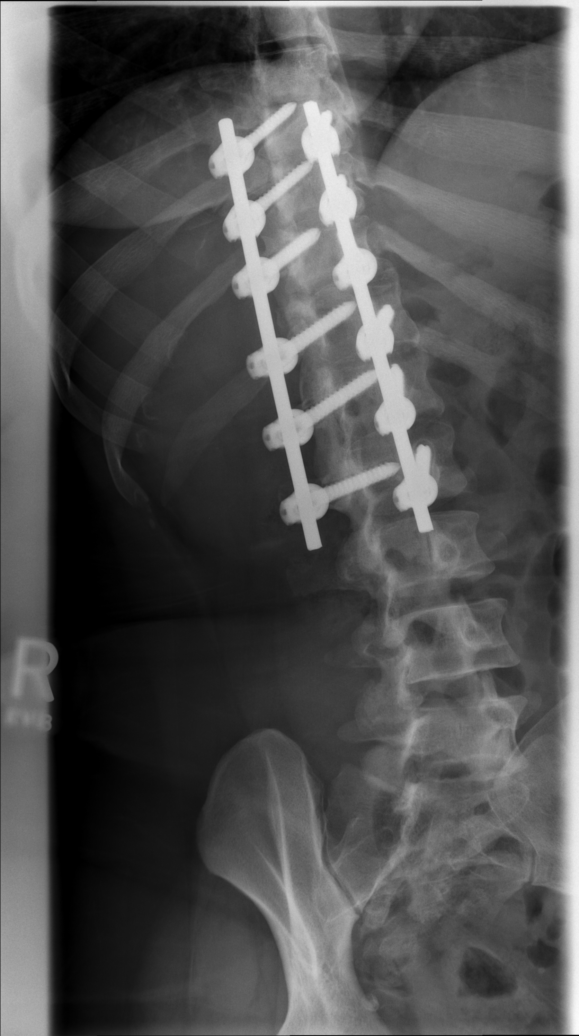

[t lumbar spine lat]
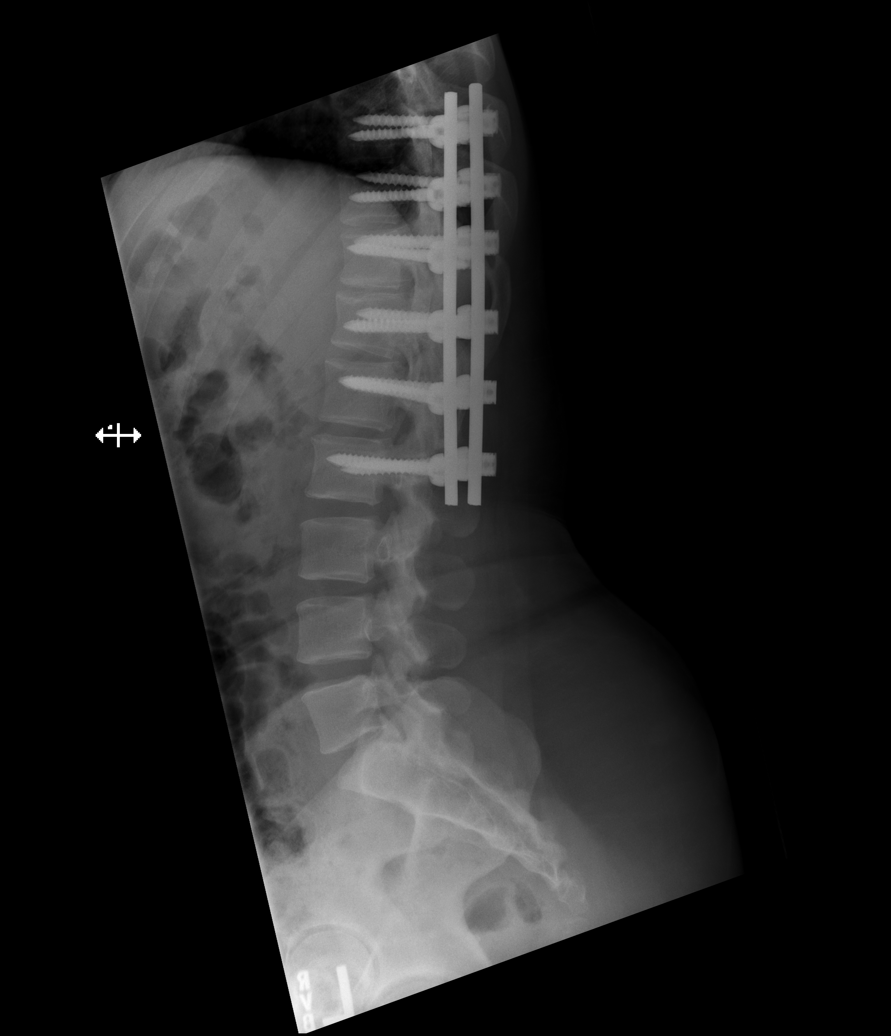

[t lumbar l-5 s-1 spot]
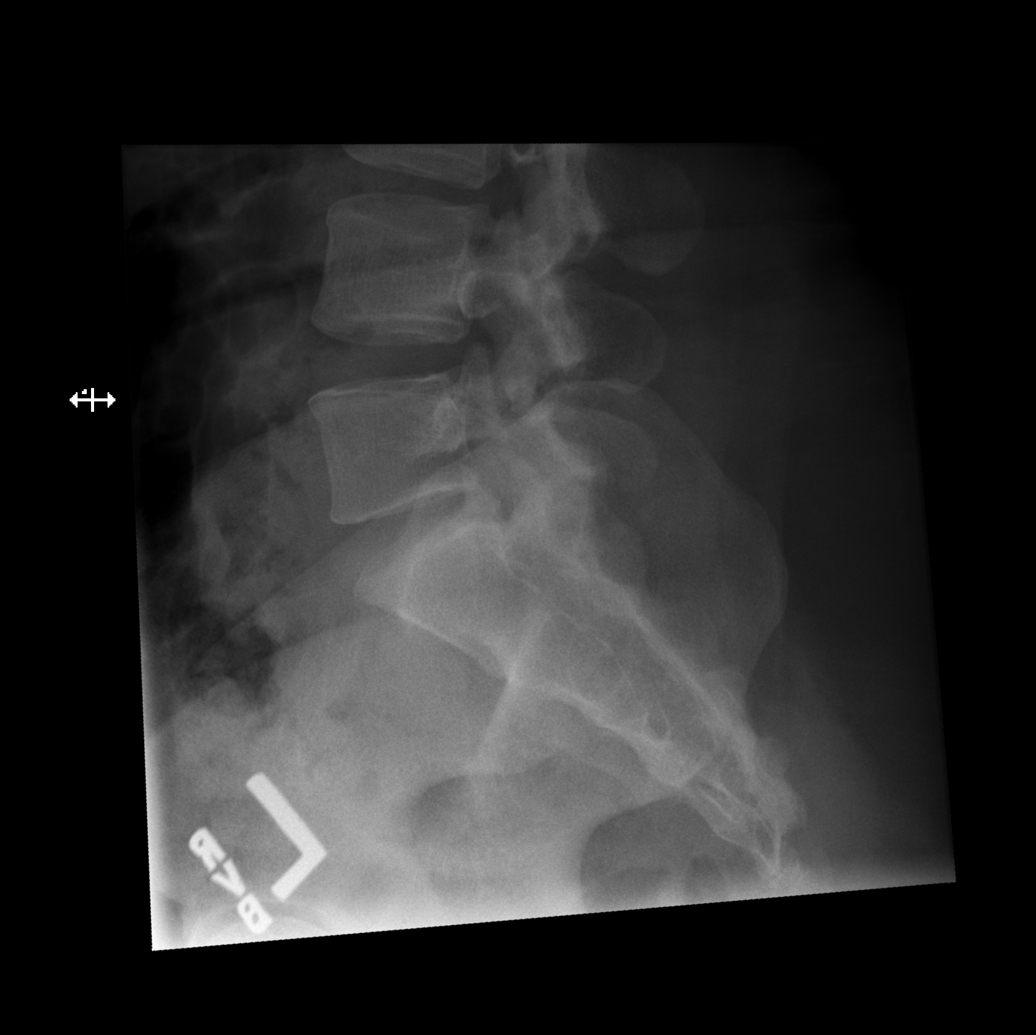

[5 of 5 positions shown; findings below may reference images not displayed]

FINDINGS: The thoracolumbar vertebral body heights are maintained. The
alignment is anatomic. There is no acute fracture static listhesis.

There is posterior spinal fusion from T9 through L2 without hardware
failure or complication.

No aggressive osseous lesion.
IMPRESSION: 1. No acute osseous injury of the lumbar spine.
2. No acute osseous injury of the thoracic spine.
3. Posterior lumbar fusion from T9 through L2 without hardware
failure or complication.
